# Patient Record
Sex: Female | Born: 1960 | Race: White | Hispanic: No | Marital: Married | Smoking: Current every day smoker
Health system: Southern US, Community
[De-identification: ages and names within clinical notes are randomized; demographics above are authoritative.]

## PROBLEM LIST (undated history)

## (undated) DIAGNOSIS — E079 Disorder of thyroid, unspecified: Secondary | ICD-10-CM

## (undated) HISTORY — PX: TONSILLECTOMY AND ADENOIDECTOMY: SHX28

## (undated) HISTORY — PX: TONSILLECTOMY: SUR1361

---

## 2018-02-03 ENCOUNTER — Ambulatory Visit: Payer: BC Managed Care – PPO | Admitting: Primary Care

## 2018-02-03 ENCOUNTER — Encounter: Payer: Self-pay | Admitting: Primary Care

## 2018-02-03 DIAGNOSIS — L989 Disorder of the skin and subcutaneous tissue, unspecified: Secondary | ICD-10-CM

## 2018-02-03 NOTE — Assessment & Plan Note (Signed)
Chronic for years, recently changed and is suspicious appearing. Will have her return for lesion removal, will send off for pathology. Prior smoker.

## 2018-02-03 NOTE — Progress Notes (Signed)
Subjective:    Patient ID: Michele Gamble, female    DOB: June 20, 1961, 57 y.o.   MRN: [id number]  HPI  Michele Gamble is a 57 year old female who presents today to establish care and discuss the problems mentioned below. Will obtain old records.  1) Skin Lesion: She's noticed a spot to the right posterior calf which has been present for years. Over the last two weeks she's noticed a change in skin texture to a scaly texture and some erythema. She's also noticed itching. She's not tried anything OTC for her symptoms. She's not itching anywhere else. She'd like the spot removed. She is a prior smoker, quit years ago. She has had a spot removed 20 years ago which was not cancerous.   Review of Systems  Constitutional: Negative for fever.  Respiratory: Negative for shortness of breath.   Cardiovascular: Negative for chest pain.  Skin: Positive for color change. Negative for rash.       Skin lesion       History reviewed. No pertinent past medical history.   Social History   Socioeconomic History  . Marital status: Married    Spouse name: Not on file  . Number of children: Not on file  . Years of education: Not on file  . Highest education level: Not on file  Occupational History  . Not on file  Social Needs  . Financial resource strain: Not on file  . Food insecurity:    Worry: Not on file    Inability: Not on file  . Transportation needs:    Medical: Not on file    Non-medical: Not on file  Tobacco Use  . Smoking status: Former Games developermoker  . Smokeless tobacco: Never Used  Substance and Sexual Activity  . Alcohol use: Never    Frequency: Never  . Drug use: Not on file  . Sexual activity: Not on file  Lifestyle  . Physical activity:    Days per week: Not on file    Minutes per session: Not on file  . Stress: Not on file  Relationships  . Social connections:    Talks on phone: Not on file    Gets together: Not on file    Attends religious service: Not on file    Active  member of club or organization: Not on file    Attends meetings of clubs or organizations: Not on file    Relationship status: Not on file  . Intimate partner violence:    Fear of current or ex partner: Not on file    Emotionally abused: Not on file    Physically abused: Not on file    Forced sexual activity: Not on file  Other Topics Concern  . Not on file  Social History Narrative   Married.   2 children.   Works for [employer]    Past Surgical History:  Procedure Laterality Date  . TONSILLECTOMY AND ADENOIDECTOMY      Family History  Problem Relation Age of Onset  . Osteoporosis Mother   . Hyperlipidemia Mother   . Diabetes Father   . Heart attack Father     No Known Allergies  No current outpatient medications on file prior to visit.   No current facility-administered medications on file prior to visit.     BP 110/70   Pulse 69   Temp 98 F (36.7 C) (Oral)   Ht 5' 5.5" (1.664 m)   Wt 201 lb 8  oz (91.4 kg)   SpO2 92%   BMI 33.02 kg/m    Objective:   Physical Exam  Constitutional: She appears well-nourished.  Neck: Neck supple.  Cardiovascular: Normal rate and regular rhythm.  Respiratory: Effort normal and breath sounds normal.  Skin: Skin is warm and dry.  1 cm circular lesion to left posterior calf. Scaly, mild erythema. Edges approximated.            Assessment & Plan:

## 2018-02-03 NOTE — Patient Instructions (Addendum)
Keep an eye on the spot on the back of your leg as discussed. Take note of any color, size, texture change.  We will see you on July 24th as scheduled, stop by the front and speak with Robin.  It was a pleasure to meet you today! Please don't hesitate to call or message me with any questions. Welcome to [facility]!

## 2018-02-15 ENCOUNTER — Encounter: Payer: Self-pay | Admitting: Primary Care

## 2018-02-15 ENCOUNTER — Ambulatory Visit: Payer: BC Managed Care – PPO | Admitting: Primary Care

## 2018-02-15 VITALS — BP 110/68 | HR 74 | Temp 98.0°F | Ht 65.5 in | Wt 204.8 lb

## 2018-02-15 DIAGNOSIS — L989 Disorder of the skin and subcutaneous tissue, unspecified: Secondary | ICD-10-CM | POA: Diagnosis not present

## 2018-02-15 NOTE — Progress Notes (Signed)
Subjective:    Patient ID: CORIN FORMISANO, female    DOB: Jan 05, 1961, 57 y.o.   MRN: [id number]  HPI  Ms. Ruddell is a 57 year old female who presents today for lesion removal. Her lesion is located to the right medial mid calf for which has been present for years. She denies changes in texture, growth, color. She'd like this removed for cosmetic reasons.   Review of Systems  Constitutional: Negative for fever.  Musculoskeletal: Negative for myalgias.  Skin: Negative for color change.       Skin lesion       No past medical history on file.   Social History   Socioeconomic History  . Marital status: Married    Spouse name: Not on file  . Number of children: Not on file  . Years of education: Not on file  . Highest education level: Not on file  Occupational History  . Not on file  Social Needs  . Financial resource strain: Not on file  . Food insecurity:    Worry: Not on file    Inability: Not on file  . Transportation needs:    Medical: Not on file    Non-medical: Not on file  Tobacco Use  . Smoking status: Former Games developer  . Smokeless tobacco: Never Used  Substance and Sexual Activity  . Alcohol use: Never    Frequency: Never  . Drug use: Not on file  . Sexual activity: Not on file  Lifestyle  . Physical activity:    Days per week: Not on file    Minutes per session: Not on file  . Stress: Not on file  Relationships  . Social connections:    Talks on phone: Not on file    Gets together: Not on file    Attends religious service: Not on file    Active member of club or organization: Not on file    Attends meetings of clubs or organizations: Not on file    Relationship status: Not on file  . Intimate partner violence:    Fear of current or ex partner: Not on file    Emotionally abused: Not on file    Physically abused: Not on file    Forced sexual activity: Not on file  Other Topics Concern  . Not on file  Social History Narrative   Married.   2 children.    Works for [employer].    Past Surgical History:  Procedure Laterality Date  . TONSILLECTOMY AND ADENOIDECTOMY      Family History  Problem Relation Age of Onset  . Osteoporosis Mother   . Hyperlipidemia Mother   . COPD Mother   . Diabetes Father   . Heart attack Father   . Hypertension Father   . Hyperlipidemia Father     No Known Allergies  No current outpatient medications on file prior to visit.   No current facility-administered medications on file prior to visit.     BP 110/68   Pulse 74   Temp 98 F (36.7 C) (Oral)   Ht 5' 5.5" (1.664 m)   Wt 204 lb 12 oz (92.9 kg)   SpO2 94%   BMI 33.55 kg/m    Objective:   Physical Exam  Constitutional: She appears well-nourished.  Cardiovascular: Normal rate.  Respiratory: Effort normal.  Skin: Skin is warm and dry.  1 cm circular, slightly scaly, light brown lesion to right medial mid calf.  Assessment & Plan:  Skin Lesion:  Located to right medial calf, midway down. Does not appear suspicious for melanoma.  Site prepped with betadine. Lidocaine 2% with epi used for analgesia. Lesion removed with dermablade device. Lesion placed into specimen container for pathology. Cautery used for bleeding, dressing applied. Patient tolerated procedure well.  Doreene NestKatherine K Dirck Butch, NP

## 2018-02-15 NOTE — Patient Instructions (Signed)
We will be in touch with your results once received.   Keep the area clean and dry. Use a bandage to protect the site from infection.  Please call me if you develop redness, swelling, drainage, increased pain to the site.   It was a pleasure to see you today!

## 2018-02-23 ENCOUNTER — Ambulatory Visit: Payer: Self-pay | Admitting: Primary Care

## 2018-03-02 ENCOUNTER — Ambulatory Visit: Payer: Self-pay | Admitting: Primary Care

## 2018-11-01 ENCOUNTER — Other Ambulatory Visit: Payer: Self-pay

## 2018-11-01 ENCOUNTER — Encounter: Payer: Self-pay | Admitting: Primary Care

## 2018-11-01 ENCOUNTER — Ambulatory Visit: Payer: BC Managed Care – PPO | Admitting: Primary Care

## 2018-11-01 VITALS — BP 110/72 | HR 97 | Temp 98.2°F | Ht 65.5 in | Wt 201.0 lb

## 2018-11-01 DIAGNOSIS — M25562 Pain in left knee: Secondary | ICD-10-CM

## 2018-11-01 DIAGNOSIS — G8929 Other chronic pain: Secondary | ICD-10-CM | POA: Insufficient documentation

## 2018-11-01 MED ORDER — NAPROXEN 500 MG PO TABS: 500.0000 mg | ORAL_TABLET | Freq: Two times a day (BID) | ORAL | 0 refills | Status: DC

## 2018-11-01 NOTE — Patient Instructions (Signed)
Stop by the lab prior to leaving today. I will notify you of your results once received.   Try taking the naproxen medication for knee pain and inflammation. Take 1 tablet by mouth twice daily with food for 7-10 days.  You can purchase a knee sleeve to use for compression when on your feet during the day.  Elevate your leg at night. You can also apply ice to your knee as needed for swelling.   It was a pleasure to see you today!

## 2018-11-01 NOTE — Progress Notes (Signed)
Subjective:    Patient ID: Michele Gamble, female    DOB: 1961/03/31, 58 y.o.   MRN: [id number]  HPI  Michele Gamble is a 58 year old female who presents today with a chief complaint of knee pain.  Her pain is located to the left anterior knee, also with swelling and stiffness. She's noticed "puffiness" over the last several months, and then developed pain and swelling two days ago. She denies injury/trauma, increased activity, open wounds, radiation of pain, weakness.   Her pain is most noticeable when waking in the morning and when walking up and down stairs.  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  She's not taken anything for her symptoms.  She has not been icing her knee, is not using a brace.  Review of Systems  Musculoskeletal: Positive for arthralgias.  Skin: Negative for color change.  Neurological: Negative for weakness.       No past medical history on file.   Social History   Socioeconomic History  . Marital status: Married    Spouse name: Not on file  . Number of children: Not on file  . Years of education: Not on file  . Highest education level: Not on file  Occupational History  . Not on file  Social Needs  . Financial resource strain: Not on file  . Food insecurity:    Worry: Not on file    Inability: Not on file  . Transportation needs:    Medical: Not on file    Non-medical: Not on file  Tobacco Use  . Smoking status: Former Games developer  . Smokeless tobacco: Never Used  Substance and Sexual Activity  . Alcohol use: Never    Frequency: Never  . Drug use: Not on file  . Sexual activity: Not on file  Lifestyle  . Physical activity:    Days per week: Not on file    Minutes per session: Not on file  . Stress: Not on file  Relationships  . Social connections:    Talks on phone: Not on file    Gets together: Not on file    Attends religious service: Not on file    Active member of club or organization: Not on  file    Attends meetings of clubs or organizations: Not on file    Relationship status: Not on file  . Intimate partner violence:    Fear of current or ex partner: Not on file    Emotionally abused: Not on file    Physically abused: Not on file    Forced sexual activity: Not on file  Other Topics Concern  . Not on file  Social History Narrative   Married.   2 children.   Works for [employer].    Past Surgical History:  Procedure Laterality Date  . TONSILLECTOMY AND ADENOIDECTOMY      Family History  Problem Relation Age of Onset  . Osteoporosis Mother   . Hyperlipidemia Mother   . COPD Mother   . Diabetes Father   . Heart attack Father   . Hypertension Father   . Hyperlipidemia Father     No Known Allergies  No current outpatient medications on file prior to visit.   No current facility-administered medications on file prior to visit.     BP 110/72   Pulse 97   Temp 98.2 F (36.8 C) (Oral)   Ht 5' 5.5" (1.664  m)   Wt 201 lb (91.2 kg)   SpO2 95%   BMI 32.94 kg/m    Objective:   Physical Exam  Constitutional: She appears well-nourished.  Cardiovascular: Normal rate.  Respiratory: Effort normal.  Musculoskeletal:     Left knee: She exhibits swelling. She exhibits normal range of motion, no erythema and no bony tenderness. No tenderness found.       Legs:     Comments: Mild swelling noted over patella, no erythema.  Skin: Skin is warm and dry. No erythema.           Assessment & Plan:

## 2018-11-01 NOTE — Assessment & Plan Note (Signed)
Likely bursitis secondary to inflammation, but given presentation would like to rule out infection versus gout.  Uric acid, BMP and CBC pending. Discussed to elevate knee when resting, ice. Also discussed to purchase a knee sleeve for compression during the day. Prescription for naproxen 500 mg twice daily prescribed to use for 7 to 10 days.  If symptoms persist then consider plain films of the knee.

## 2018-11-02 ENCOUNTER — Other Ambulatory Visit: Payer: Self-pay | Admitting: Primary Care

## 2018-11-02 DIAGNOSIS — N289 Disorder of kidney and ureter, unspecified: Secondary | ICD-10-CM

## 2018-11-02 LAB — BASIC METABOLIC PANEL
BUN: 15 mg/dL (ref 6–23)
CO2: 28 mEq/L (ref 19–32)
Calcium: 9.6 mg/dL (ref 8.4–10.5)
Chloride: 104 mEq/L (ref 96–112)
Creatinine, Ser: 0.99 mg/dL (ref 0.40–1.20)
GFR: 57.68 mL/min — ABNORMAL LOW (ref >60.00)
Glucose, Bld: 92 mg/dL (ref 70–99)
Potassium: 4.5 mEq/L (ref 3.5–5.1)
Sodium: 140 mEq/L (ref 135–145)

## 2018-11-02 LAB — CBC WITH DIFFERENTIAL/PLATELET
Basophils Absolute: 0.1 10*3/uL (ref 0.0–0.1)
Basophils Relative: 0.8 % (ref 0.0–3.0)
Eosinophils Absolute: 0.1 10*3/uL (ref 0.0–0.7)
Eosinophils Relative: 1.1 % (ref 0.0–5.0)
HCT: 43.5 % (ref 36.0–46.0)
Hemoglobin: 14.7 g/dL (ref 12.0–15.0)
Lymphocytes Relative: 24.1 % (ref 12.0–46.0)
Lymphs Abs: 2.3 10*3/uL (ref 0.7–4.0)
MCHC: 33.8 g/dL (ref 30.0–36.0)
MCV: 93.5 fl (ref 78.0–100.0)
Monocytes Absolute: 1.1 10*3/uL — ABNORMAL HIGH (ref 0.1–1.0)
Monocytes Relative: 11.3 % (ref 3.0–12.0)
Neutro Abs: 5.9 10*3/uL (ref 1.4–7.7)
Neutrophils Relative %: 62.7 % (ref 43.0–77.0)
Platelets: 188 10*3/uL (ref 150.0–400.0)
RBC: 4.65 Mil/uL (ref 3.87–5.11)
RDW: 13.9 % (ref 11.5–15.5)
WBC: 9.4 10*3/uL (ref 4.0–10.5)

## 2018-11-02 LAB — URIC ACID: Uric Acid, Serum: 5.1 mg/dL (ref 2.4–7.0)

## 2018-12-04 ENCOUNTER — Other Ambulatory Visit: Payer: BC Managed Care – PPO

## 2019-04-20 ENCOUNTER — Other Ambulatory Visit: Payer: Self-pay

## 2019-04-20 ENCOUNTER — Encounter: Payer: Self-pay | Admitting: Family Medicine

## 2019-04-20 ENCOUNTER — Ambulatory Visit (INDEPENDENT_AMBULATORY_CARE_PROVIDER_SITE_OTHER)
Admission: RE | Admit: 2019-04-20 | Discharge: 2019-04-20 | Disposition: A | Discharge status: Home / Self Care | Payer: BC Managed Care – PPO | Source: Ambulatory Visit | Attending: Family Medicine | Admitting: Family Medicine

## 2019-04-20 ENCOUNTER — Ambulatory Visit: Payer: BC Managed Care – PPO | Admitting: Family Medicine

## 2019-04-20 VITALS — BP 108/62 | HR 79 | Temp 97.8°F | Ht 65.5 in | Wt 215.6 lb

## 2019-04-20 DIAGNOSIS — R822 Biliuria: Secondary | ICD-10-CM | POA: Diagnosis not present

## 2019-04-20 DIAGNOSIS — M25562 Pain in left knee: Secondary | ICD-10-CM | POA: Diagnosis not present

## 2019-04-20 DIAGNOSIS — G8929 Other chronic pain: Secondary | ICD-10-CM

## 2019-04-20 DIAGNOSIS — R8290 Unspecified abnormal findings in urine: Secondary | ICD-10-CM | POA: Insufficient documentation

## 2019-04-20 LAB — COMPREHENSIVE METABOLIC PANEL
ALT: 20 U/L (ref 0–35)
AST: 21 U/L (ref 0–37)
Albumin: 4.2 g/dL (ref 3.5–5.2)
Alkaline Phosphatase: 64 U/L (ref 39–117)
BUN: 19 mg/dL (ref 6–23)
CO2: 25 mEq/L (ref 19–32)
Calcium: 9.4 mg/dL (ref 8.4–10.5)
Chloride: 106 mEq/L (ref 96–112)
Creatinine, Ser: 0.82 mg/dL (ref 0.40–1.20)
GFR: 71.57 mL/min (ref >60.00)
Glucose, Bld: 88 mg/dL (ref 70–99)
Potassium: 4.7 mEq/L (ref 3.5–5.1)
Sodium: 139 mEq/L (ref 135–145)
Total Bilirubin: 0.3 mg/dL (ref 0.2–1.2)
Total Protein: 6.7 g/dL (ref 6.0–8.3)

## 2019-04-20 LAB — POCT URINALYSIS DIPSTICK
Bilirubin, UA: POSITIVE
Blood, UA: NEGATIVE
Glucose, UA: NEGATIVE
Ketones, UA: POSITIVE
Leukocytes, UA: NEGATIVE
Nitrite, UA: NEGATIVE
Protein, UA: POSITIVE — AB
Spec Grav, UA: >=1.03 — AB (ref 1.010–1.025)
Urobilinogen, UA: 0.2 E.U./dL
pH, UA: 5 (ref 5.0–8.0)

## 2019-04-20 NOTE — Patient Instructions (Addendum)
Exam overall ok today.  Possible wear and tear arthritis of the knee.  Check knee xrays today.  Ok to use naprosyn as needed.  Elevate leg, use ice/heating pad (whatever soothes better).  Urine looking ok today but very concentrated - increase water, avoid bladder irritants like caffeinated beverages, dark sodas, spicy foods.   Kegel Exercises  Kegel exercises can help strengthen your pelvic floor muscles. The pelvic floor is a group of muscles that support your rectum, small intestine, and bladder. In females, pelvic floor muscles also help support the womb (uterus). These muscles help you control the flow of urine and stool. Kegel exercises are painless and simple, and they do not require any equipment. Your provider may suggest Kegel exercises to:  Improve bladder and bowel control.  Improve sexual response.  Improve weak pelvic floor muscles after surgery to remove the uterus (hysterectomy) or pregnancy (females).  Improve weak pelvic floor muscles after prostate gland removal or surgery (males). Kegel exercises involve squeezing your pelvic floor muscles, which are the same muscles you squeeze when you try to stop the flow of urine or keep from passing gas. The exercises can be done while sitting, standing, or lying down, but it is best to vary your position. Exercises How to do Kegel exercises: 1. Squeeze your pelvic floor muscles tight. You should feel a tight lift in your rectal area. If you are a female, you should also feel a tightness in your vaginal area. Keep your stomach, buttocks, and legs relaxed. 2. Hold the muscles tight for up to 10 seconds. 3. Breathe normally. 4. Relax your muscles. 5. Repeat as told by your health care provider. Repeat this exercise daily as told by your health care provider. Continue to do this exercise for at least 4-6 weeks, or for as long as told by your health care provider. You may be referred to a physical therapist who can help you learn more  about how to do Kegel exercises. Depending on your condition, your health care provider may recommend:  Varying how long you squeeze your muscles.  Doing several sets of exercises every day.  Doing exercises for several weeks.  Making Kegel exercises a part of your regular exercise routine. This information is not intended to replace advice given to you by your health care provider. Make sure you discuss any questions you have with your health care provider. Document Released: 06/28/2012 Document Revised: 03/01/2018 Document Reviewed: 03/01/2018 Elsevier Patient Education  2020 [employer].

## 2019-04-20 NOTE — Assessment & Plan Note (Addendum)
Overall benign exam. Not consistent with bursitis, tendonitis, or ligament injury. Anticipate knee osteoarthritis. Discussed with patient. Supportive care reviewed. Discussed tylenol, NSAID, knee brace and ice/elevation. Knee exercises provided today. Update if not improving with treatment. Check Xray today.

## 2019-04-20 NOTE — Progress Notes (Signed)
This visit was conducted in person.  BP 108/62   Pulse 79   Temp 97.8 F (36.6 C)   Ht 5' 5.5" (1.664 m)   Wt 215 lb 9 oz (97.8 kg)   SpO2 94%   BMI 35.33 kg/m    CC: knee pain, ?UTI Subjective:    Patient ID: Michele Gamble, female    DOB: Sep 04, 1960, 58 y.o.   MRN: [id number]  HPI: Michele Gamble is a 58 y.o. female presenting on 04/20/2019 for Knee Pain (since around March. Naproxen helps.) and Foul urine odor (has been present for a while, not sure time frame.)   H/o L knee pain since 10/2018 seen by PCP thought bursitis treated with naprosyn and knee sleeve brace. CBC, urate at that time were normal. Naprosyn did significantly help but temporarily. Describes bilateral anterior knee and stiffness and swelling. She has also been using the knee brace with benefit. Denies inciting trauma/injury. 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 No locking. Some instability when she stands from seated position on floor. Worse going up stairs.   Several month h/o malodorous urine. Denies dysuria, urgency, frequency, hematuria. Some stress incontinence symptoms but not new. No flank pain, abd pain, fevers/chills, n/v, vaginal discharge or rash.   15 lb weight gain noted. No abd pain.      Relevant past medical, surgical, family and social history reviewed and updated as indicated. Interim medical history since our last visit reviewed. Allergies and medications reviewed and updated. Outpatient Medications Prior to Visit  Medication Sig Dispense Refill  . naproxen (NAPROSYN) 500 MG tablet Take 1 tablet (500 mg total) by mouth 2 (two) times daily with a meal. (Patient not taking: Reported on 04/20/2019) 30 tablet 0   No facility-administered medications prior to visit.      Per HPI unless specifically indicated in ROS section below Review of Systems Objective:    BP 108/62   Pulse 79   Temp 97.8 F (36.6 C)   Ht 5' 5.5" (1.664 m)   Wt 215 lb 9 oz (97.8 kg)   SpO2 94%   BMI 35.33  kg/m   Wt Readings from Last 3 Encounters:  04/20/19 215 lb 9 oz (97.8 kg)  11/01/18 201 lb (91.2 kg)  02/15/18 204 lb 12 oz (92.9 kg)    Physical Exam Vitals signs and nursing note reviewed.  Constitutional:      General: She is not in acute distress.    Appearance: Normal appearance. She is obese. She is not ill-appearing.  Abdominal:     General: Abdomen is flat. Bowel sounds are normal. There is no distension.     Palpations: Abdomen is soft. There is no mass.     Tenderness: There is no abdominal tenderness. There is no right CVA tenderness, left CVA tenderness, guarding or rebound.     Hernia: No hernia is present.  Musculoskeletal: Normal range of motion.        General: Swelling (mild L knee) present.     Right lower leg: No edema.     Left lower leg: No edema.     Comments:  R knee WNL L knee exam: No deformity on inspection. No pain with palpation of knee landmarks. No effusion/swelling noted. FROM in flex/extension without crepitus. No popliteal fullness. Neg drawer test. Neg mcmurray test. No pain with valgus/varus stress. No PFgrind. No abnormal patellar mobility.   Skin:    Findings: No rash.  Neurological:  Mental Status: She is alert.       Results for orders placed or performed in visit on 04/20/19  POCT urinalysis dipstick  Result Value Ref Range   Color, UA dark yellow    Clarity, UA slightly cloudy    Glucose, UA Negative Negative   Bilirubin, UA positive    Ketones, UA positive    Spec Grav, UA >=1.030 (A) 1.010 - 1.025   Blood, UA negative    pH, UA 5.0 5.0 - 8.0   Protein, UA Positive (A) Negative   Urobilinogen, UA 0.2 0.2 or 1.0 E.U./dL   Nitrite, UA negative    Leukocytes, UA Negative Negative   Appearance     Odor     Lab Results  Component Value Date   CREATININE 0.99 11/01/2018   BUN 15 11/01/2018   NA 140 11/01/2018   K 4.5 11/01/2018   CL 104 11/01/2018   CO2 28 11/01/2018   No results found for: ALT, AST, GGT,  ALKPHOS, BILITOT  Assessment & Plan:   Problem List Items Addressed This Visit    Chronic pain of left knee - Primary    Overall benign exam. Not consistent with bursitis, tendonitis, or ligament injury. Anticipate knee osteoarthritis. Discussed with patient. Supportive care reviewed. Discussed tylenol, NSAID, knee brace and ice/elevation. Knee exercises provided today. Update if not improving with treatment. Check Xray today.       Relevant Orders   DG Knee 4 Views W/Patella Left   Abnormal urine odor    No signs of infection, no blood. Urine very concentrated. rec push water, limit bladder irritants. For bilirubinuria - check CMP.       Relevant Orders   POCT urinalysis dipstick (Completed)    Other Visit Diagnoses    Bilirubinuria       Relevant Orders   Comprehensive metabolic panel       No orders of the defined types were placed in this encounter.  Orders Placed This Encounter  Procedures  . DG Knee 4 Views W/Patella Left    Standing Status:   Future    Number of Occurrences:   1    Standing Expiration Date:   06/19/2020    Order Specific Question:   Reason for Exam (SYMPTOM  OR DIAGNOSIS REQUIRED)    Answer:   L anterior knee pain x 5 months without injury eval arthritis    Order Specific Question:   Is patient pregnant?    Answer:   No    Order Specific Question:   Preferred imaging location?    Answer:   [facility]    Order Specific Question:   Radiology Contrast Protocol - do NOT remove file path    Answer:   [email or web address]  . Comprehensive metabolic panel  . POCT urinalysis dipstick    Follow up plan: No follow-ups on file.  Eustaquio Boyden, MD

## 2019-04-20 NOTE — Assessment & Plan Note (Addendum)
No signs of infection, no blood. Urine very concentrated. rec push water, limit bladder irritants. For bilirubinuria - check CMP.

## 2019-11-09 ENCOUNTER — Ambulatory Visit: Payer: BC Managed Care – PPO | Admitting: Podiatry

## 2019-11-09 ENCOUNTER — Other Ambulatory Visit: Payer: Self-pay

## 2019-11-09 VITALS — Temp 97.8°F

## 2019-11-09 DIAGNOSIS — M79676 Pain in unspecified toe(s): Secondary | ICD-10-CM

## 2019-11-09 DIAGNOSIS — L600 Ingrowing nail: Secondary | ICD-10-CM | POA: Diagnosis not present

## 2019-12-18 ENCOUNTER — Ambulatory Visit
Admission: EM | Admit: 2019-12-18 | Discharge: 2019-12-18 | Disposition: A | Discharge status: Home / Self Care | Payer: BC Managed Care – PPO | Attending: Physician Assistant | Admitting: Physician Assistant

## 2019-12-18 DIAGNOSIS — M79604 Pain in right leg: Secondary | ICD-10-CM | POA: Diagnosis not present

## 2019-12-18 MED ORDER — FUROSEMIDE 20 MG PO TABS: 20.0000 mg | ORAL_TABLET | Freq: Every day | ORAL | 0 refills | Status: AC

## 2019-12-18 MED ORDER — DICLOFENAC SODIUM 50 MG PO TBEC
50.0000 mg | DELAYED_RELEASE_TABLET | Freq: Two times a day (BID) | ORAL | 0 refills | Status: DC
Start: 2019-12-18 — End: 2019-12-26

## 2019-12-18 NOTE — ED Provider Notes (Signed)
[facility]    CSN: [id number] Arrival date & time: 12/18/19  1026      History   Chief Complaint Chief Complaint  Patient presents with  . Leg Pain    HPI Michele Gamble is a 59 y.o. female.   59 year old female comes in for right leg pain.  States has had leg soreness for more than a month that has been constant without obvious exacerbating or alleviating factor.  Yesterday while walking, with sudden pain to the knee and lower leg, and now with painful weightbearing.  Denies injury/trauma.  She notices some swelling to the legs bilaterally.  No erythema, warmth. Denies chest pain, shortness of breath, orthopnea. Has not taken anything for the symptoms.     History reviewed. No pertinent past medical history.  Patient Active Problem List   Diagnosis Date Noted  . Abnormal urine odor 04/20/2019  . Chronic pain of left knee 11/01/2018  . Skin lesion 02/03/2018    Past Surgical History:  Procedure Laterality Date  . TONSILLECTOMY AND ADENOIDECTOMY      OB History   No obstetric history on file.      Home Medications    Prior to Admission medications   Medication Sig Start Date End Date Taking? Authorizing Provider  diclofenac (VOLTAREN) 50 MG EC tablet Take 1 tablet (50 mg total) by mouth 2 (two) times daily. 12/18/19   Cathie Hoops, Valton Schwartz V, PA-C  furosemide (LASIX) 20 MG tablet Take 1-2 tablets (20-40 mg total) by mouth daily. 12/18/19   Belinda Fisher, PA-C    Family History Family History  Problem Relation Age of Onset  . Osteoporosis Mother   . Hyperlipidemia Mother   . COPD Mother   . Diabetes Father   . Heart attack Father   . Hypertension Father   . Hyperlipidemia Father     Social History Social History   Tobacco Use  . Smoking status: Current Every Day Smoker  . Smokeless tobacco: Never Used  Substance Use Topics  . Alcohol use: Never  . Drug use: Never     Allergies   Patient has no known allergies.   Review of Systems Review of  Systems  Reason unable to perform ROS: See HPI as above.     Physical Exam Triage Vital Signs ED Triage Vitals  Enc Vitals Group     BP 12/18/19 1053 126/81     Pulse Rate 12/18/19 1053 80     Resp 12/18/19 1053 18     Temp 12/18/19 1053 98 F (36.7 C)     Temp Source 12/18/19 1053 Oral     SpO2 12/18/19 1053 92 %     Weight --      Height --      Head Circumference --      Peak Flow --      Pain Score 12/18/19 1100 1     Pain Loc --      Pain Edu? --      Excl. in GC? --    No data found.  Updated Vital Signs BP 126/81 (BP Location: Left Arm)   Pulse 80   Temp 98 F (36.7 C) (Oral)   Resp 18   SpO2 92%   Physical Exam Constitutional:      General: She is not in acute distress.    Appearance: Normal appearance. She is well-developed. She is not toxic-appearing or diaphoretic.  HENT:     Head: Normocephalic and atraumatic.  Eyes:  Conjunctiva/sclera: Conjunctivae normal.     Pupils: Pupils are equal, round, and reactive to light.  Pulmonary:     Effort: Pulmonary effort is normal. No respiratory distress.     Comments: Speaking in full sentences without difficulty Musculoskeletal:     Cervical back: Normal range of motion and neck supple.     Comments: No tenderness to palpation of the back. Full ROM of back.  Bilateral 2+ pitting edema to the knee. No erythema, warmth. No tenderness to palpation of BLE. Full ROM of BLE. Strength 5/5. Sensation intact. Negative homan's   Skin:    General: Skin is warm and dry.  Neurological:     Mental Status: She is alert and oriented to person, place, and time.      UC Treatments / Results  Labs (all labs ordered are listed, but only abnormal results are displayed) Labs Reviewed - No data to display  EKG   Radiology No results found.  Procedures Procedures (including critical care time)  Medications Ordered in UC Medications - No data to display  Initial Impression / Assessment and Plan / UC Course  I  have reviewed the triage vital signs and the nursing notes.  Pertinent labs & imaging results that were available during my care of the patient were reviewed by me and considered in my medical decision making (see chart for details).    No alarming signs on exam. Bilateral pitting edema, with low suspicion of DVT, CHF. Will provide lasix for swelling. Will provide diclofenac and knee sleeve for symptomatic management. Return precautions given. Patient expresses understanding and agrees to plan.  Final Clinical Impressions(s) / UC Diagnoses   Final diagnoses:  Right leg pain    ED Prescriptions    Medication Sig Dispense Auth. Provider   furosemide (LASIX) 20 MG tablet Take 1-2 tablets (20-40 mg total) by mouth daily. 4 tablet Charon Akamine V, PA-C   diclofenac (VOLTAREN) 50 MG EC tablet Take 1 tablet (50 mg total) by mouth 2 (two) times daily. 20 tablet Ok Edwards, PA-C     PDMP not reviewed this encounter.   Ok Edwards, PA-C 12/18/19 1442

## 2019-12-18 NOTE — Discharge Instructions (Signed)
Lasix 1-2 tabs in the next 2-3 days to help remove some fluid in the legs. Decrease salt intake. Diclofenac as directed for pain and inflammation. Ice compress to the knee. Keep legs elevated. You can start wearing compression stockings during work after swelling is improved. Follow up with sports medicine if symptoms not improving.

## 2019-12-18 NOTE — ED Triage Notes (Signed)
Pt c/o rt leg soreness for over a month. States pain was severe to rt leg yesterday and is now using a cane for support. Denies injury. Denies long car rides or flying. 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

## 2019-12-26 ENCOUNTER — Other Ambulatory Visit: Payer: Self-pay

## 2019-12-26 ENCOUNTER — Ambulatory Visit
Admission: EM | Admit: 2019-12-26 | Discharge: 2019-12-26 | Discharge status: Absent without leave | Payer: BC Managed Care – PPO | Attending: Physician Assistant | Admitting: Physician Assistant

## 2019-12-26 MED ORDER — DICLOFENAC SODIUM 50 MG PO TBEC: 50.0000 mg | DELAYED_RELEASE_TABLET | Freq: Two times a day (BID) | ORAL | 1 refills | Status: AC

## 2020-01-07 NOTE — Progress Notes (Signed)
  Subjective:  Patient ID: FLYNN LININGER, female    DOB: 07-07-1961,  MRN: [id number]  Chief Complaint  Patient presents with  . Nail Problem    R hallux - lateral side. x2 months. Pt stated, "No injury or pain. Urgent care told me that I might need a biopsy. I had an abnormal growth under my R 2nd toe 20 years ago.     59 y.o. female presents with the above complaint. History confirmed with patient.   Objective:  Physical Exam: warm, good capillary refill, no trophic changes or ulcerative lesions, normal DP and PT pulses and normal sensory exam. Left Foot: normal exam, no swelling, tenderness, instability; ligaments intact, full range of motion of all ankle/foot joints  Right Foot: hallux with subungual hemorrhage, no nail bed darkening.  Assessment:   1. Ingrown nail   2. Pain around toenail      Plan:  Patient was evaluated and treated and all questions answered.  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

## 2021-04-06 IMAGING — DX DG KNEE COMPLETE 4+V*L*
4 series · 4 of 4 positions shown · non-contrast
Comparison: None.

CLINICAL DATA: Left anterior knee pain for the past 5 months. No
injury.

EXAM:
LEFT KNEE - COMPLETE 4+ VIEW

[knee ap]
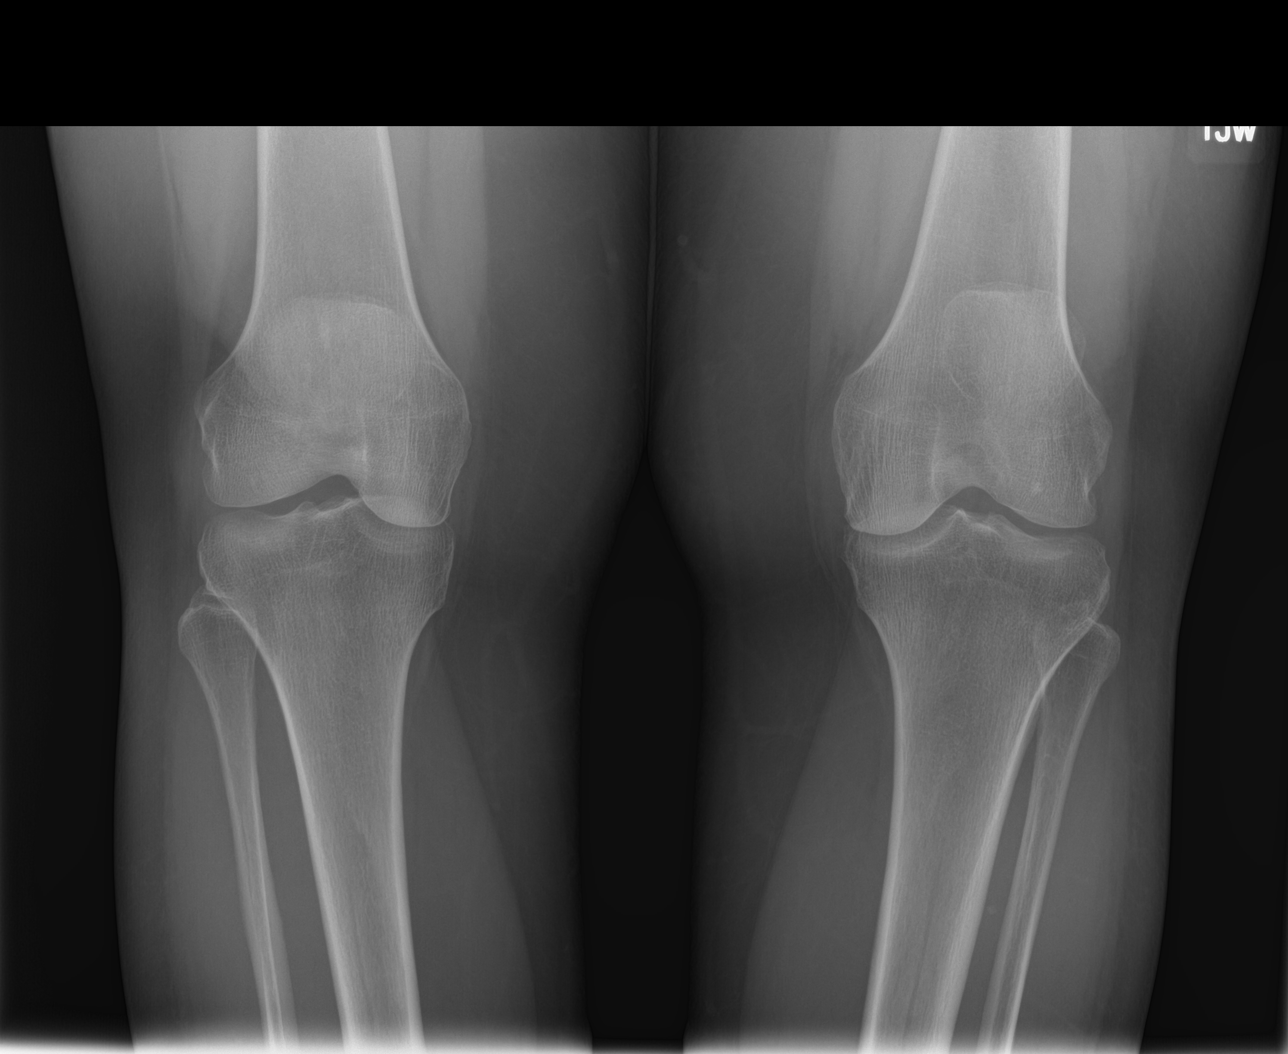

[knee tunnel]
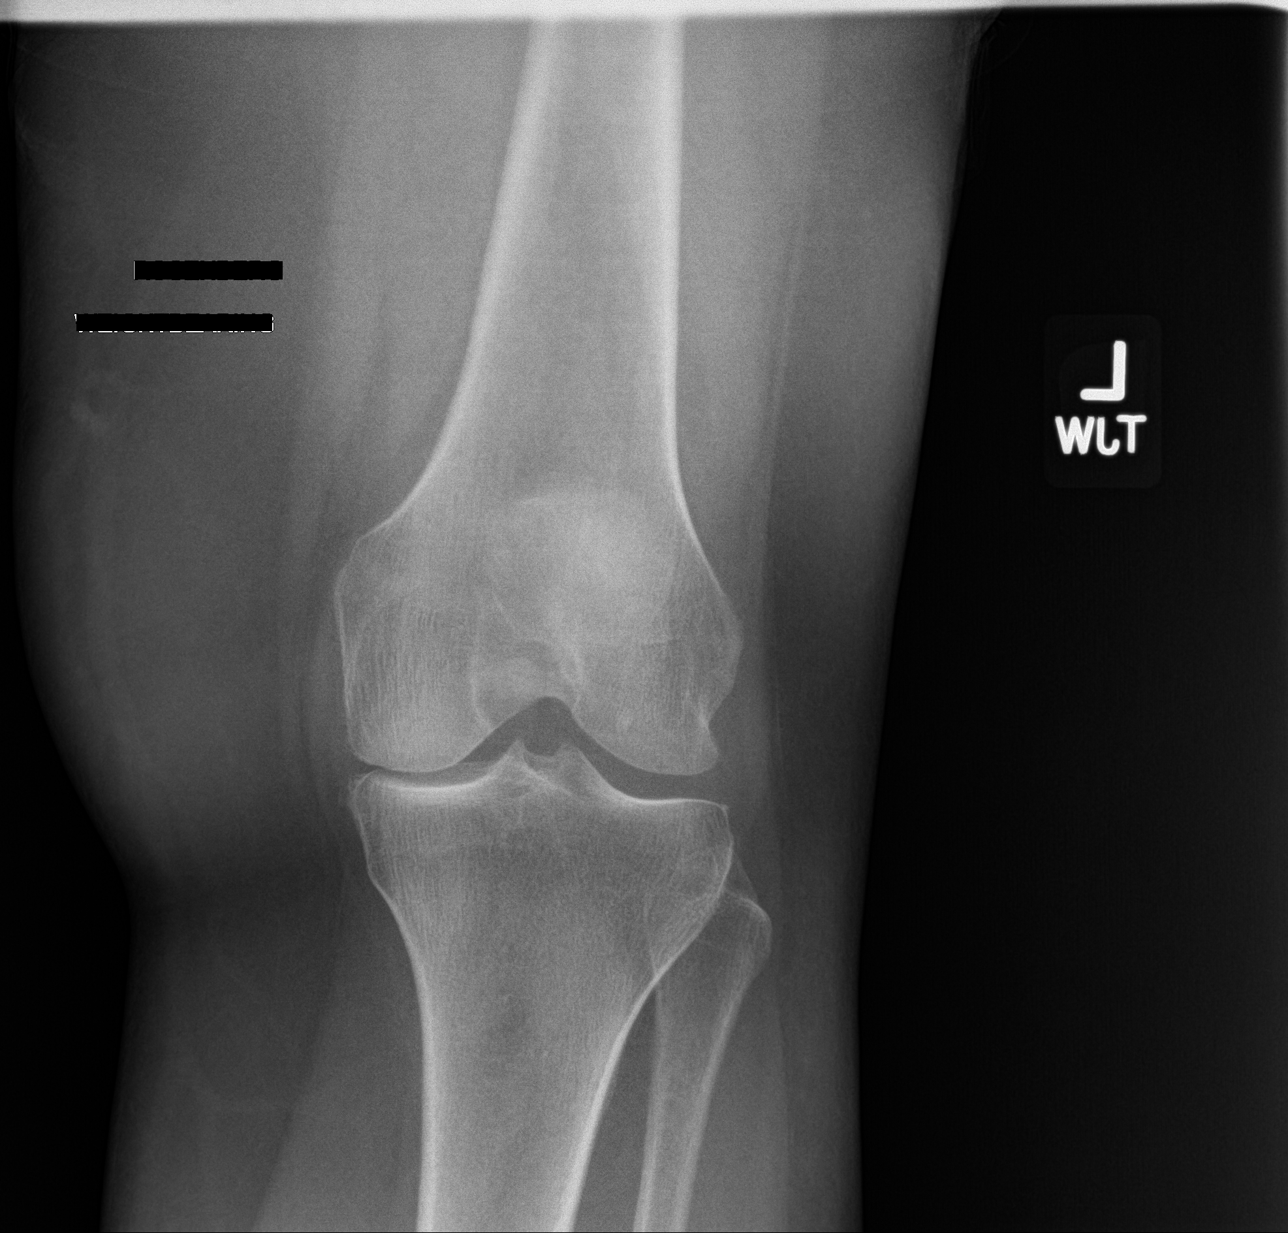

[knee lat]
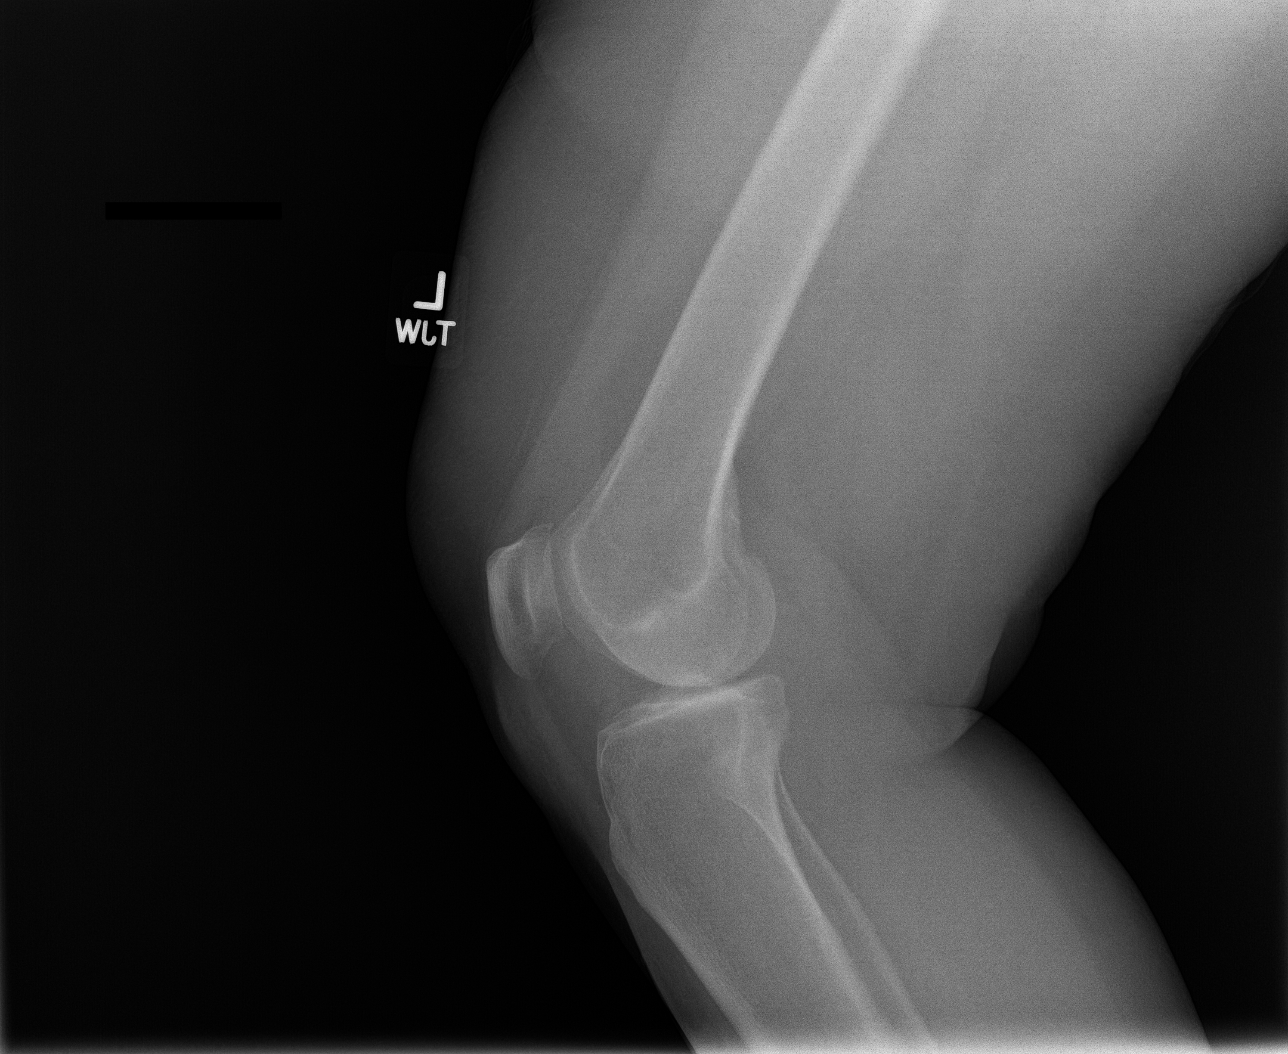

[patella skyline]
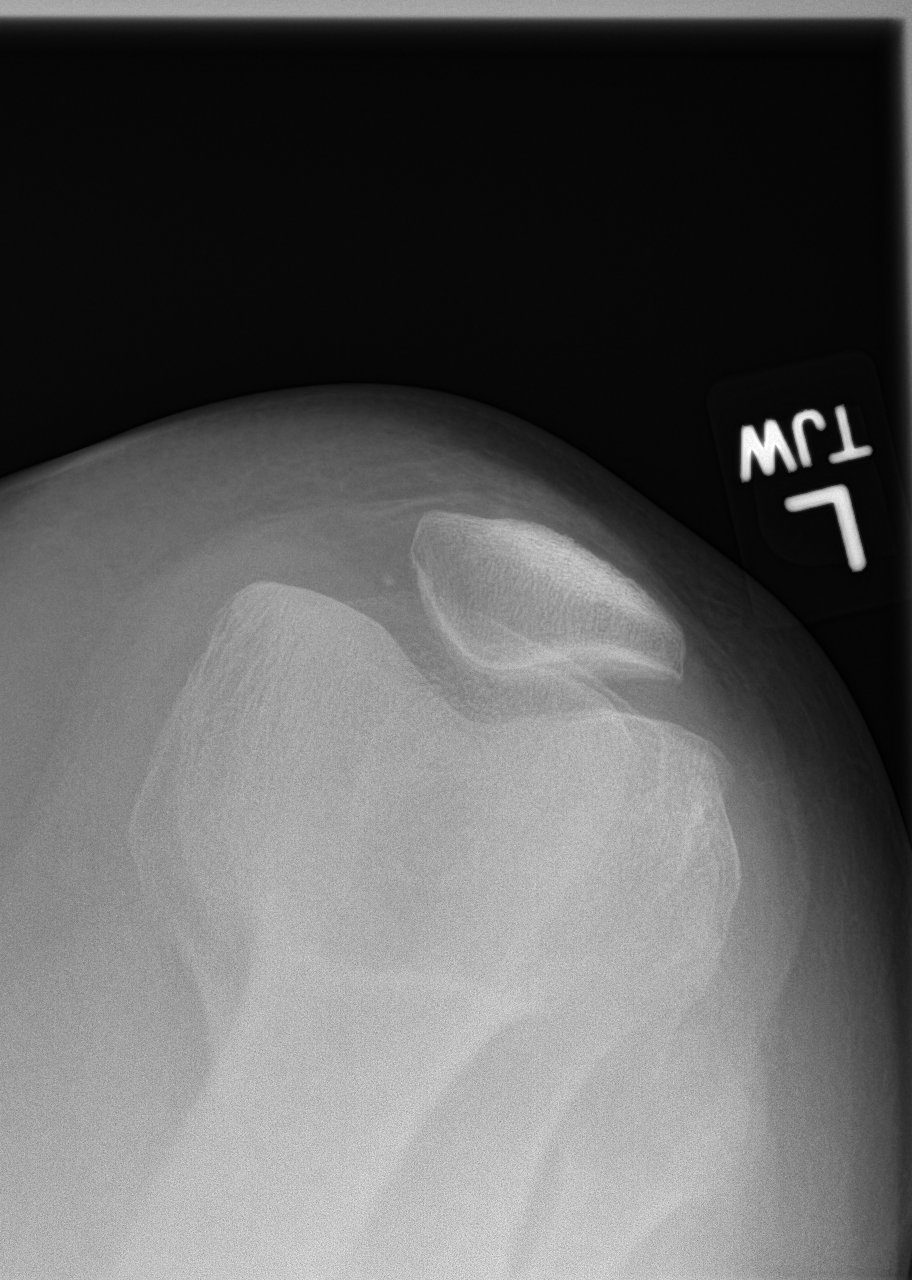

[4 of 4 positions shown; findings below may reference images not displayed]

FINDINGS: No acute fracture or dislocation. Small suprapatellar joint
effusion. Mild medial compartment joint space narrowing with small
marginal osteophytes. The lateral and patellofemoral compartment
joint spaces are preserved. Bone mineralization is normal. Soft
tissues are unremarkable.

Single weight-bearing view of the right knee demonstrates mild
medial compartment joint space narrowing.
IMPRESSION: 1. Mild bilateral medial compartment osteoarthritis.

## 2024-07-17 ENCOUNTER — Encounter: Payer: Self-pay | Admitting: Emergency Medicine

## 2024-07-17 ENCOUNTER — Other Ambulatory Visit: Payer: Self-pay

## 2024-07-17 ENCOUNTER — Ambulatory Visit: Admission: EM | Admit: 2024-07-17 | Discharge: 2024-07-17 | Disposition: A | Discharge status: Home / Self Care

## 2024-07-17 DIAGNOSIS — J101 Influenza due to other identified influenza virus with other respiratory manifestations: Secondary | ICD-10-CM | POA: Diagnosis not present

## 2024-07-17 DIAGNOSIS — J069 Acute upper respiratory infection, unspecified: Secondary | ICD-10-CM

## 2024-07-17 HISTORY — DX: Disorder of thyroid, unspecified: E07.9

## 2024-07-17 LAB — POCT INFLUENZA A/B
Influenza A, POC: POSITIVE — AB
Influenza B, POC: NEGATIVE

## 2024-07-17 MED ORDER — OSELTAMIVIR PHOSPHATE 75 MG PO CAPS: 75.0000 mg | ORAL_CAPSULE | Freq: Two times a day (BID) | ORAL | 0 refills | Status: AC

## 2024-07-17 NOTE — ED Provider Notes (Signed)
 " [facility]    CSN: [id number] Arrival date & time: 07/17/24  0932      History   Chief Complaint No chief complaint on file.   HPI Michele Gamble is a 63 y.o. female.   Patient presents today due to 3 days of cough, fatigue, headache, loss of taste and smell, body aches, and generalized weakness.  Patient states she has been taking Pamprin and Sudafed for symptom relief.  Patient states that her daughter tested positive for influenza A and she has been in contact with her.  The history is provided by the patient.    Past Medical History:  Diagnosis Date   Thyroid disease     Patient Active Problem List   Diagnosis Date Noted   Abnormal urine odor 04/20/2019   Chronic pain of left knee 11/01/2018   Skin lesion 02/03/2018    Past Surgical History:  Procedure Laterality Date   TONSILLECTOMY     TONSILLECTOMY AND ADENOIDECTOMY      OB History   No obstetric history on file.      Home Medications    Prior to Admission medications  Medication Sig Start Date End Date Taking? Authorizing Provider  liothyronine (CYTOMEL) 5 MCG tablet Take 10 mcg by mouth daily. 02/03/24  Yes [provider]  oseltamivir  (TAMIFLU ) 75 MG capsule Take 1 capsule (75 mg total) by mouth every 12 (twelve) hours. 07/17/24  Yes Andra Krabbe C, PA-C  diclofenac  (VOLTAREN ) 50 MG EC tablet Take 1 tablet (50 mg total) by mouth 2 (two) times daily. 12/26/19   Sofia, Leslie K, PA-C  furosemide  (LASIX ) 20 MG tablet Take 1-2 tablets (20-40 mg total) by mouth daily. 12/18/19   Babara Greig GAILS, PA-C    Family History Family History  Problem Relation Age of Onset   Osteoporosis Mother    Hyperlipidemia Mother    COPD Mother    Diabetes Father    Heart attack Father    Hypertension Father    Hyperlipidemia Father     Social History Social History[1]   Allergies   Patient has no known allergies.   Review of Systems Review of Systems   Physical Exam Triage Vital  Signs ED Triage Vitals  Encounter Vitals Group     BP 07/17/24 1248 103/68     Girls Systolic BP Percentile --      Girls Diastolic BP Percentile --      Boys Systolic BP Percentile --      Boys Diastolic BP Percentile --      Pulse Rate 07/17/24 1248 86     Resp 07/17/24 1248 18     Temp 07/17/24 1248 98.6 F (37 C)     Temp Source 07/17/24 1248 Oral     SpO2 07/17/24 1248 96 %     Weight --      Height --      Head Circumference --      Peak Flow --      Pain Score 07/17/24 1303 2     Pain Loc --      Pain Education --      Exclude from Growth Chart --    No data found.  Updated Vital Signs BP 103/68 (BP Location: Left Arm)   Pulse 86   Temp 98.6 F (37 C) (Oral)   Resp 18   SpO2 96%   Visual Acuity Right Eye Distance:   Left Eye Distance:   Bilateral Distance:  Right Eye Near:   Left Eye Near:    Bilateral Near:     Physical Exam Vitals and nursing note reviewed.  Constitutional:      General: She is not in acute distress.    Appearance: Normal appearance. She is ill-appearing. She is not toxic-appearing or diaphoretic.  HENT:     Right Ear: Tympanic membrane, ear canal and external ear normal.     Left Ear: Tympanic membrane, ear canal and external ear normal.     Nose: Congestion (moderately enlarged turbinates) present. No rhinorrhea.     Mouth/Throat:     Mouth: Mucous membranes are moist.     Pharynx: Oropharynx is clear. No oropharyngeal exudate or posterior oropharyngeal erythema.  Eyes:     General: No scleral icterus. Cardiovascular:     Rate and Rhythm: Normal rate and regular rhythm.     Heart sounds: Normal heart sounds.  Pulmonary:     Effort: Pulmonary effort is normal. No respiratory distress.     Breath sounds: Normal breath sounds. No wheezing or rhonchi.  Skin:    General: Skin is warm.  Neurological:     Mental Status: She is alert and oriented to person, place, and time.  Psychiatric:        Mood and Affect: Mood normal.         Behavior: Behavior normal.      UC Treatments / Results  Labs (all labs ordered are listed, but only abnormal results are displayed) Labs Reviewed  POCT INFLUENZA A/B - Abnormal; Notable for the following components:      Result Value   Influenza A, POC Positive (*)    All other components within normal limits    EKG   Radiology No results found.  Procedures Procedures (including critical care time)  Medications Ordered in UC Medications - No data to display  Initial Impression / Assessment and Plan / UC Course  I have reviewed the triage vital signs and the nursing notes.  Pertinent labs & imaging results that were available during my care of the patient were reviewed by me and considered in my medical decision making (see chart for details).     Final Clinical Impressions(s) / UC Diagnoses   Final diagnoses:  Viral URI  Influenza A     Discharge Instructions      You been diagnosed with a viral illness today. -Viruses have to run their course and medicines that are prescribed are meant to help with symptoms. - With viruses usually feel poorly from 3 to 7 days with cough being the last symptoms to resolve.  -Cough can linger from days to weeks.  Antibiotics are not effective for viruses. -If your cough lasts more than 2 weeks and you are coughing so hard that you are vomiting or feel like you could pass out we need to follow-up with PCP for further testing and evaluation. -Rest, increase water intake, may use pseudoephedrine for nasal congestion, Delsym (dextromethorphan) or honey as needed for cough, and ibuprofen and/or Tylenol as directed on packaging for pain and fever. -If you have hypertension you should take Coricidin or other OTC meds approved for people with high blood pressure. -You may use a spoonful of honey every 4-6 hours as needed for throat pain and cough. -Warm tea with honey and lemon are helpful for soothe throat as well.  Chloraseptic and  Cepacol make a throat lozenge with numbing medication, can be purchased over-the-counter. -May also use Flonase or sinus  rinse for sinus pressure or nasal congestion.  Be sure to use distilled bottled water for sinus rinses. -May use coolmist humidifier to open up nasal passages -May elevate head to assist with postnasal drainage. -If you feel poorly (fever, fatigue, shortness of breath, nausea, etc.) for more than 10 days to be sure to follow-up with PCP or in clinic for further evaluation and additional treatments. If you experience chest pain with shortness of breath or pulse oxygen less than 95% you should report to the ER.     ED Prescriptions     Medication Sig Dispense Auth. Provider   oseltamivir  (TAMIFLU ) 75 MG capsule Take 1 capsule (75 mg total) by mouth every 12 (twelve) hours. 10 capsule Andra Corean BROCKS, PA-C      PDMP not reviewed this encounter.    [1]  Social History Tobacco Use   Smoking status: Every Day   Smokeless tobacco: Never  Vaping Use   Vaping status: Never Used  Substance Use Topics   Alcohol use: Never   Drug use: Never     Andra Corean BROCKS, PA-C 07/17/24 1357  "

## 2024-07-17 NOTE — Discharge Instructions (Signed)

## 2024-07-17 NOTE — ED Triage Notes (Signed)
 Symptoms started 3 days ago.  Has had general weakness, headache, sinus congestion and cough, poor appetite, no sense of taste or smell.  Reports ears are stuff and feels like pressure.  Complains of inside of nose burns.    Has had sudafed and Pamprin   Reports her adult daughter (does not live with her)but has had flu A.  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
